# Patient Record
Sex: Male | Born: 1970 | Race: White | Hispanic: No | Smoking: Current every day smoker
Health system: Southern US, Community
[De-identification: ages and names within clinical notes are randomized; demographics above are authoritative.]

---

## 1998-08-04 ENCOUNTER — Emergency Department (HOSPITAL_COMMUNITY): Admission: EM | Admit: 1998-08-04 | Discharge: 1998-08-04 | Payer: Self-pay

## 2000-02-25 ENCOUNTER — Emergency Department (HOSPITAL_COMMUNITY): Admission: EM | Admit: 2000-02-25 | Discharge: 2000-02-25 | Payer: Self-pay | Admitting: Emergency Medicine

## 2000-10-05 ENCOUNTER — Emergency Department (HOSPITAL_COMMUNITY): Admission: EM | Admit: 2000-10-05 | Discharge: 2000-10-05 | Payer: Self-pay | Admitting: Emergency Medicine

## 2002-01-09 ENCOUNTER — Emergency Department (HOSPITAL_COMMUNITY): Admission: EM | Admit: 2002-01-09 | Discharge: 2002-01-09 | Payer: Self-pay

## 2002-05-16 ENCOUNTER — Emergency Department (HOSPITAL_COMMUNITY): Admission: EM | Admit: 2002-05-16 | Discharge: 2002-05-16 | Payer: Self-pay | Admitting: Emergency Medicine

## 2005-03-06 ENCOUNTER — Emergency Department (HOSPITAL_COMMUNITY): Admission: EM | Admit: 2005-03-06 | Discharge: 2005-03-06 | Payer: Self-pay | Admitting: Emergency Medicine

## 2005-06-09 ENCOUNTER — Emergency Department: Payer: Self-pay | Admitting: Emergency Medicine

## 2006-10-26 ENCOUNTER — Emergency Department: Payer: Self-pay | Admitting: Unknown Physician Specialty

## 2007-06-13 ENCOUNTER — Emergency Department (HOSPITAL_COMMUNITY): Admission: EM | Admit: 2007-06-13 | Discharge: 2007-06-13 | Payer: Self-pay | Admitting: Family Medicine

## 2008-04-28 ENCOUNTER — Emergency Department (HOSPITAL_COMMUNITY): Admission: EM | Admit: 2008-04-28 | Discharge: 2008-04-28 | Payer: Self-pay | Admitting: Emergency Medicine

## 2009-05-01 ENCOUNTER — Emergency Department: Payer: Self-pay | Admitting: Emergency Medicine

## 2011-09-24 LAB — RAPID STREP SCREEN (MED CTR MEBANE ONLY): Streptococcus, Group A Screen (Direct): NEGATIVE

## 2013-05-03 ENCOUNTER — Emergency Department (HOSPITAL_COMMUNITY): Payer: Self-pay

## 2013-05-03 ENCOUNTER — Emergency Department (HOSPITAL_COMMUNITY)
Admission: EM | Admit: 2013-05-03 | Discharge: 2013-05-03 | Disposition: A | Discharge status: Home / Self Care | Payer: Self-pay | Attending: Emergency Medicine | Admitting: Emergency Medicine

## 2013-05-03 ENCOUNTER — Encounter (HOSPITAL_COMMUNITY): Payer: Self-pay | Admitting: Emergency Medicine

## 2013-05-03 DIAGNOSIS — K0889 Other specified disorders of teeth and supporting structures: Secondary | ICD-10-CM

## 2013-05-03 DIAGNOSIS — K089 Disorder of teeth and supporting structures, unspecified: Secondary | ICD-10-CM | POA: Insufficient documentation

## 2013-05-03 DIAGNOSIS — F17200 Nicotine dependence, unspecified, uncomplicated: Secondary | ICD-10-CM | POA: Insufficient documentation

## 2013-05-03 DIAGNOSIS — R05 Cough: Secondary | ICD-10-CM | POA: Insufficient documentation

## 2013-05-03 DIAGNOSIS — R059 Cough, unspecified: Secondary | ICD-10-CM | POA: Insufficient documentation

## 2013-05-03 DIAGNOSIS — J3489 Other specified disorders of nose and nasal sinuses: Secondary | ICD-10-CM | POA: Insufficient documentation

## 2013-05-03 DIAGNOSIS — J069 Acute upper respiratory infection, unspecified: Secondary | ICD-10-CM | POA: Insufficient documentation

## 2013-05-03 MED ORDER — BENZONATATE 100 MG PO CAPS: 100.0000 mg | ORAL_CAPSULE | Freq: Three times a day (TID) | ORAL | Status: AC

## 2013-05-03 MED ORDER — PENICILLIN V POTASSIUM 500 MG PO TABS: 500.0000 mg | ORAL_TABLET | Freq: Four times a day (QID) | ORAL | Status: AC

## 2013-05-03 MED ORDER — TRAMADOL HCL 50 MG PO TABS: 50.0000 mg | ORAL_TABLET | Freq: Four times a day (QID) | ORAL | Status: AC | PRN

## 2013-05-03 NOTE — ED Notes (Signed)
Pt c/o left sided dental pain and cough and head congestion

## 2013-05-03 NOTE — ED Provider Notes (Signed)
History     CSN: [id number]  Arrival date & time 05/03/13  1106   First MD Initiated Contact with Patient 05/03/13 1146      Chief Complaint  Patient presents with  . Dental Pain  . Cough    (Consider location/radiation/quality/duration/timing/severity/associated sxs/prior treatment) Patient is a 42 y.o. male presenting with tooth pain and cough. The history is provided by the patient.  Dental PainThe primary symptoms include cough. Primary symptoms do not include dental injury, headaches, fever or shortness of breath. Episode onset: 2-3 weeks ago. The symptoms are worsening. The symptoms occur constantly.  Additional symptoms include: dental sensitivity to temperature, gum swelling and gum tenderness. Additional symptoms do not include: purulent gums, trismus, facial swelling, trouble swallowing, pain with swallowing and ear pain.   Cough Cough characteristics:  Productive Sputum characteristics:  Yellow Onset quality:  Gradual Duration:  2 weeks Progression:  Worsening Smoker: yes   Context: not sick contacts   Relieved by:  None tried Associated symptoms: rhinorrhea   Associated symptoms: no chest pain, no chills, no ear pain, no fever, no headaches, no shortness of breath and no wheezing     History reviewed. No pertinent past medical history.  History reviewed. No pertinent past surgical history.  History reviewed. No pertinent family history.  History  Substance Use Topics  . Smoking status: Current Every Day Smoker  . Smokeless tobacco: Not on file  . Alcohol Use: Yes     Comment: occ      Review of Systems  Constitutional: Negative for fever and chills.  HENT: Positive for congestion and rhinorrhea. Negative for ear pain, facial swelling, trouble swallowing, neck pain and neck stiffness.   Respiratory: Positive for cough. Negative for shortness of breath and wheezing.   Cardiovascular: Negative for chest pain.  Neurological: Negative for headaches.  All  other systems reviewed and are negative.    Allergies  Codeine and Vicodin  Home Medications   Current Outpatient Rx  Name  Route  Sig  Dispense  Refill  . Multiple Vitamin (MULTIVITAMIN WITH MINERALS) TABS   Oral   Take 1 tablet by mouth daily.           BP 126/60  Pulse 87  Temp(Src) 98.8 F (37.1 C) (Oral)  Resp 18  SpO2 97%  Physical Exam  Nursing note and vitals reviewed. Constitutional: He is oriented to person, place, and time. He appears well-developed and well-nourished. No distress.  HENT:  Head: Normocephalic and atraumatic. No trismus in the jaw.  Nose: Mucosal edema and rhinorrhea present.  Mouth/Throat: Uvula is midline, oropharynx is clear and moist and mucous membranes are normal. Abnormal dentition. No dental abscesses or edematous. No oropharyngeal exudate, posterior oropharyngeal edema, posterior oropharyngeal erythema or tonsillar abscesses.  Pt able to open and close mouth with out difficulty. Airway intact. Uvula midline. Mild gingival swelling with tenderness over affected area, but no fluctuance. No swelling or tenderness of submental and submandibular regions.  No sublingual tenderness.  Eyes: Conjunctivae and EOM are normal.  Neck: Normal range of motion and full passive range of motion without pain. Neck supple.  Cardiovascular: Normal rate, regular rhythm and normal heart sounds.   Pulmonary/Chest: Effort normal and breath sounds normal. No respiratory distress. He has no wheezes. He has no rales.  Musculoskeletal: Normal range of motion.  Lymphadenopathy:       Head (right side): No submental, no submandibular, no tonsillar, no preauricular and no posterior auricular adenopathy present.  Head (left side): No submental, no submandibular, no tonsillar, no preauricular and no posterior auricular adenopathy present.    He has no cervical adenopathy.  Neurological: He is alert and oriented to person, place, and time.  Skin: Skin is warm and  dry. No rash noted. He is not diaphoretic.  Psychiatric: He has a normal mood and affect.    ED Course  Procedures (including critical care time)  Labs Reviewed - No data to display Dg Chest 2 View  05/03/2013  *RADIOLOGY REPORT*  Clinical Data: Cough  CHEST - 2 VIEW  Comparison: None.  Findings: Lungs clear.  Heart size and pulmonary vascularity are normal.  No adenopathy.  No bone lesions.  IMPRESSION: No abnormality noted.   Original Report Authenticated By: Bretta Bang, M.D.      No diagnosis found.    MDM  Patient with toothache.  No gross abscess.  Exam unconcerning for Ludwig's angina or spread of infection.  Will treat with penicillin and pain medicine.  Urged patient to follow-up with dentist.  Pt CXR negative for acute infiltrate. Patients symptoms are consistent with URI, likely viral etiology. Discussed that antibiotics are not indicated for viral infections. Pt will be discharged with symptomatic treatment.  Verbalizes understanding and is agreeable with plan. Pt is hemodynamically stable & in NAD prior to dc.        Pascal [place], PA-C 05/03/13 1540

## 2013-05-03 NOTE — ED Notes (Signed)
Returned from radiology. 

## 2013-05-05 NOTE — ED Provider Notes (Signed)
Medical screening examination/treatment/procedure(s) were performed by non-physician practitioner and as supervising physician I was immediately available for consultation/collaboration.   Azarel Banner, MD 05/05/13 1534 

## 2017-11-27 ENCOUNTER — Emergency Department (HOSPITAL_BASED_OUTPATIENT_CLINIC_OR_DEPARTMENT_OTHER): Payer: PRIVATE HEALTH INSURANCE

## 2017-11-27 ENCOUNTER — Encounter (HOSPITAL_BASED_OUTPATIENT_CLINIC_OR_DEPARTMENT_OTHER): Payer: Self-pay | Admitting: *Deleted

## 2017-11-27 ENCOUNTER — Emergency Department (HOSPITAL_BASED_OUTPATIENT_CLINIC_OR_DEPARTMENT_OTHER)
Admission: EM | Admit: 2017-11-27 | Discharge: 2017-11-27 | Disposition: A | Discharge status: Home / Self Care | Payer: PRIVATE HEALTH INSURANCE | Attending: Emergency Medicine | Admitting: Emergency Medicine

## 2017-11-27 ENCOUNTER — Other Ambulatory Visit: Payer: Self-pay

## 2017-11-27 DIAGNOSIS — M25512 Pain in left shoulder: Secondary | ICD-10-CM | POA: Diagnosis not present

## 2017-11-27 DIAGNOSIS — R0789 Other chest pain: Secondary | ICD-10-CM | POA: Diagnosis not present

## 2017-11-27 DIAGNOSIS — R079 Chest pain, unspecified: Secondary | ICD-10-CM | POA: Diagnosis present

## 2017-11-27 DIAGNOSIS — F17210 Nicotine dependence, cigarettes, uncomplicated: Secondary | ICD-10-CM | POA: Insufficient documentation

## 2017-11-27 DIAGNOSIS — Z79899 Other long term (current) drug therapy: Secondary | ICD-10-CM | POA: Diagnosis not present

## 2017-11-27 LAB — BASIC METABOLIC PANEL
Anion gap: 5 (ref 5–15)
BUN: 16 mg/dL (ref 6–20)
CO2: 27 mmol/L (ref 22–32)
Calcium: 9.5 mg/dL (ref 8.9–10.3)
Chloride: 102 mmol/L (ref 101–111)
Creatinine, Ser: 0.94 mg/dL (ref 0.61–1.24)
GFR calc Af Amer: >60 mL/min (ref >60)
GFR calc non Af Amer: >60 mL/min (ref >60)
Glucose, Bld: 90 mg/dL (ref 65–99)
Potassium: 3.9 mmol/L (ref 3.5–5.1)
Sodium: 134 mmol/L — ABNORMAL LOW (ref 135–145)

## 2017-11-27 LAB — CBC
HCT: 41.2 % (ref 39.0–52.0)
Hemoglobin: 14.2 g/dL (ref 13.0–17.0)
MCH: 32.1 pg (ref 26.0–34.0)
MCHC: 34.5 g/dL (ref 30.0–36.0)
MCV: 93 fL (ref 78.0–100.0)
Platelets: 270 10*3/uL (ref 150–400)
RBC: 4.43 MIL/uL (ref 4.22–5.81)
RDW: 12.7 % (ref 11.5–15.5)
WBC: 6.5 10*3/uL (ref 4.0–10.5)

## 2017-11-27 LAB — CBG MONITORING, ED: Glucose-Capillary: 92 mg/dL (ref 65–99)

## 2017-11-27 LAB — TROPONIN I
Troponin I: <0.03 ng/mL (ref <0.03)
Troponin I: <0.03 ng/mL (ref <0.03)

## 2017-11-27 NOTE — ED Notes (Signed)
EDP spoke with Pt. Before last troponin about follow up care.

## 2017-11-27 NOTE — Discharge Instructions (Signed)
Please follow-up and establish care with a primary care provider, as well as with cardiology for further evaluation and treatment of your chest pain.  Please return to the emergency department if you develop any new or worsening symptoms.  You can take ibuprofen as prescribed over-the-counter, as needed for your pain, as it may have a musculoskeletal cause as well.

## 2017-11-27 NOTE — ED Notes (Signed)
Pt. Reports he had a family issue that started this chest pain he feels today.

## 2017-11-27 NOTE — ED Provider Notes (Signed)
[facility] EMERGENCY DEPARTMENT Provider Note   CSN: [id number] Arrival date & time: 11/27/17  1249     History   Chief Complaint Chief Complaint  Patient presents with  . Chest Pain    HPI Gregory Pierce is a 46 y.o. male who is previously healthy who presents with chest pain after getting into an argument at home.  Patient reports he has had increased stress in his life lately.  He has had chest pain in the past related to anxiety.  Patient describes his pain as a  dull ache at this time, however reports prior it was sharp.  He denies any significant shortness of breath.  He denies pleuritic symptoms.  He has had some radiation of pain to his left shoulder and left back, as well as left ribs.  He denies any abdominal pain, nausea, vomiting, urinary symptoms.  He took 2 full dose aspirin prior to arrival.  He is not on any medication for anxiety.  He does smoke.  He does not have any other known medical problems.  He denies any recent long trips, surgeries, known cancer, new leg pain or swelling, history of blood clots.  Patient does have family history of heart disease.  HPI  History reviewed. No pertinent past medical history.  There are no active problems to display for this patient.   History reviewed. No pertinent surgical history.     Home Medications    Prior to Admission medications   Medication Sig Start Date End Date Taking? Authorizing Provider  Multiple Vitamin (MULTIVITAMIN WITH MINERALS) TABS Take 1 tablet by mouth daily.   Yes [provider]  benzonatate (TESSALON) 100 MG capsule Take 1 capsule (100 mg total) by mouth every 8 (eight) hours. 05/03/13   Santiago GladLaisure, Heather, PA-C  traMADol (ULTRAM) 50 MG tablet Take 1 tablet (50 mg total) by mouth every 6 (six) hours as needed for pain. 05/03/13   Santiago GladLaisure, Heather, PA-C    Family History No family history on file.  Social History Social History   Tobacco Use  . Smoking status: Current Every  Day Smoker  . Smokeless tobacco: Never Used  Substance Use Topics  . Alcohol use: Yes    Comment: occ  . Drug use: No     Allergies   Codeine and Vicodin [hydrocodone-acetaminophen]   Review of Systems Review of Systems  Constitutional: Negative for chills and fever.  HENT: Negative for facial swelling and sore throat.   Respiratory: Negative for shortness of breath.   Cardiovascular: Positive for chest pain. Negative for leg swelling.  Gastrointestinal: Negative for abdominal pain, nausea and vomiting.  Genitourinary: Negative for dysuria.  Musculoskeletal: Negative for back pain.  Skin: Negative for rash and wound.  Neurological: Negative for headaches.  Psychiatric/Behavioral: The patient is not nervous/anxious.      Physical Exam Updated Vital Signs BP (!) 104/51 (BP Location: Left Arm)   Pulse (!) 55   Temp 97.6 F (36.4 C) (Oral)   Resp 15   Ht 5\' 10"  (1.778 m)   Wt 69.4 kg (153 lb)   SpO2 98%   BMI 21.95 kg/m   Physical Exam  Constitutional: He appears well-developed and well-nourished. No distress.  HENT:  Head: Normocephalic and atraumatic.  Mouth/Throat: Oropharynx is clear and moist. No oropharyngeal exudate.  Eyes: Conjunctivae are normal. Pupils are equal, round, and reactive to light. Right eye exhibits no discharge. Left eye exhibits no discharge. No scleral icterus.  Neck: Normal range of motion.  Neck supple. No thyromegaly present.  Cardiovascular: Normal rate, regular rhythm, normal heart sounds and intact distal pulses. Exam reveals no gallop and no friction rub.  No murmur heard. Pulmonary/Chest: Effort normal and breath sounds normal. No stridor. No respiratory distress. He has no wheezes. He has no rales.        Abdominal: Soft. Bowel sounds are normal. He exhibits no distension. There is no tenderness. There is no rebound and no guarding.  Musculoskeletal: He exhibits no edema.  Lymphadenopathy:    He has no cervical adenopathy.    Neurological: He is alert. Coordination normal.  Skin: Skin is warm and dry. No rash noted. He is not diaphoretic. No pallor.  Psychiatric: He has a normal mood and affect.  Nursing note and vitals reviewed.    ED Treatments / Results  Labs (all labs ordered are listed, but only abnormal results are displayed) Labs Reviewed  BASIC METABOLIC PANEL - Abnormal; Notable for the following components:      Result Value   Sodium 134 (*)    All other components within normal limits  CBC  TROPONIN I  TROPONIN I  CBG MONITORING, ED    EKG  EKG Interpretation  Date/Time:  Friday November 27 2017 13:04:14 EST Ventricular Rate:  59 PR Interval:    QRS Duration: 86 QT Interval:  418 QTC Calculation: 415 R Axis:   92 Text Interpretation:  Sinus rhythm `no acute st/t changes Confirmed by Cathren LaineSteinl, Kevin ([id number]) on 11/27/2017 1:13:01 PM       Radiology Dg Chest 2 View  Result Date: 11/27/2017 CLINICAL DATA:  LEFT-sided chest pain. Smoker. No shortness of breath. EXAM: CHEST  2 VIEW COMPARISON:  05/03/2013. FINDINGS: The heart size and mediastinal contours are within normal limits. Both lungs are clear. The visualized skeletal structures are unremarkable. IMPRESSION: No active cardiopulmonary disease.  No change from priors. Electronically Signed   By: Elsie StainJohn T Curnes M.D.   On: 11/27/2017 13:47    Procedures Procedures (including critical care time)  Medications Ordered in ED Medications - No data to display   Initial Impression / Assessment and Plan / ED Course  I have reviewed the triage vital signs and the nursing notes.  Pertinent labs & imaging results that were available during my care of the patient were reviewed by me and considered in my medical decision making (see chart for details).     Patient with most likely chest pain related to anxiety or musculoskeletal cause.  Labs are unremarkable.  Delta troponin negative.  PERC negative. HEART score is 3. Chest x-ray is  normal and unchanged from priors.  EKG shows NSR and no ST or T wave abnormalities.  Will discharge patient home with follow-up to PCP and cardiology for recheck and further evaluation.  Return precautions discussed.  Patient understands and agrees with plan.  Patient vitals stable throughout ED course and discharged in satisfactory condition.  Patient also evaluated by Dr. Denton LankSteinl who guided the patient's management and agrees with plan.  Final Clinical Impressions(s) / ED Diagnoses   Final diagnoses:  Atypical chest pain    ED Discharge Orders    None       Emi HolesLaw, Cinderella Christoffersen M, PA-C 11/28/17 [id number]    Cathren LaineSteinl, Kevin, MD 11/28/17 1217

## 2017-11-27 NOTE — ED Triage Notes (Signed)
States he had anxiety and drove to his friends house. He was having chest pain and tried to control his breathing but the sharp pain in his chest continues.

## 2017-11-27 NOTE — ED Notes (Signed)
CBG is 92. 

## 2018-03-24 ENCOUNTER — Emergency Department (HOSPITAL_BASED_OUTPATIENT_CLINIC_OR_DEPARTMENT_OTHER)
Admission: EM | Admit: 2018-03-24 | Discharge: 2018-03-24 | Disposition: A | Discharge status: Home / Self Care | Payer: PRIVATE HEALTH INSURANCE | Attending: Emergency Medicine | Admitting: Emergency Medicine

## 2018-03-24 ENCOUNTER — Encounter (HOSPITAL_BASED_OUTPATIENT_CLINIC_OR_DEPARTMENT_OTHER): Payer: Self-pay

## 2018-03-24 ENCOUNTER — Other Ambulatory Visit: Payer: Self-pay

## 2018-03-24 DIAGNOSIS — Z79899 Other long term (current) drug therapy: Secondary | ICD-10-CM | POA: Diagnosis not present

## 2018-03-24 DIAGNOSIS — Y990 Civilian activity done for income or pay: Secondary | ICD-10-CM | POA: Diagnosis not present

## 2018-03-24 DIAGNOSIS — S46911A Strain of unspecified muscle, fascia and tendon at shoulder and upper arm level, right arm, initial encounter: Secondary | ICD-10-CM | POA: Diagnosis not present

## 2018-03-24 DIAGNOSIS — Y33XXXA Other specified events, undetermined intent, initial encounter: Secondary | ICD-10-CM | POA: Insufficient documentation

## 2018-03-24 DIAGNOSIS — Y9289 Other specified places as the place of occurrence of the external cause: Secondary | ICD-10-CM | POA: Diagnosis not present

## 2018-03-24 DIAGNOSIS — S4991XA Unspecified injury of right shoulder and upper arm, initial encounter: Secondary | ICD-10-CM | POA: Diagnosis present

## 2018-03-24 DIAGNOSIS — F17210 Nicotine dependence, cigarettes, uncomplicated: Secondary | ICD-10-CM | POA: Diagnosis not present

## 2018-03-24 DIAGNOSIS — Y9389 Activity, other specified: Secondary | ICD-10-CM | POA: Diagnosis not present

## 2018-03-24 MED ORDER — IBUPROFEN 400 MG PO TABS: 400.0000 mg | ORAL_TABLET | Freq: Four times a day (QID) | ORAL | 0 refills | Status: AC | PRN

## 2018-03-24 NOTE — ED Triage Notes (Addendum)
C/o "injured shoulder over time"-states increase in pain to right shoulder x 2 days-pain worse with movement-NAD-steady gait

## 2018-03-24 NOTE — Discharge Instructions (Signed)
You may alternate taking Tylenol and Ibuprofen as needed for pain control. You may take 400-600 mg of ibuprofen every 6 hours and [phone] mg of Tylenol every 6 hours. Do not exceed 4000 mg of Tylenol daily as this can lead to liver damage. Also, make sure to take Ibuprofen with meals as it can cause an upset stomach. Do not take other NSAIDs while taking Ibuprofen such as (Aleve, Naprosyn, Aspirin, Celebrex, etc) and do not take more than the prescribed dose as this can lead to ulcers and bleeding in your GI tract. You may use warm and cold compresses to help with your symptoms.   Please follow up with your primary doctor within the next 7-10 days for re-evaluation and further treatment of your symptoms.  If symptoms do not improve you may follow-up with the orthopedic doctor that is listed above to schedule appointment for further treatment.  Please return to the ER sooner if you have any new or worsening symptoms.

## 2018-03-24 NOTE — ED Notes (Signed)
ED Provider at bedside. 

## 2018-03-24 NOTE — ED Provider Notes (Signed)
[facility] EMERGENCY DEPARTMENT Provider Note   CSN: [id number] Arrival date & time: 03/24/18  1312     History   Chief Complaint Chief Complaint  Patient presents with  . Shoulder Injury    HPI Gregory Pierce is a 47 y.o. male.  HPI   Pt is a 47 y/o male with h/o right rotator cuff tendinitis presents to the ED today c/o of right shoulder pain that has been ongoing for several months to a year that worsened yesterday after lifting heavy objects while at work. Pain is located to the posterior aspect of the shoulder. Pain is about 7/10 at rest and 10/10 with movement. Pain radiates down the RLE to the hand. Also c/o pain to right trapezius muscles and right side of neck. 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 Reports paresthesias to thumb and 3rd, 4th, and 5th digits. No numbness or weakness to hands or upper extremities.  Pt is right handed.  No fevers, chest pain, shortness of breath, or syncope.  No associated diaphoresis, nausea, vomiting. No recent falls or trauma.  He has not taken any pain medications for his symptoms.  History reviewed. No pertinent past medical history.  There are no active problems to display for this patient.   History reviewed. No pertinent surgical history.      Home Medications    Prior to Admission medications   Medication Sig Start Date End Date Taking? Authorizing Provider  benzonatate (TESSALON) 100 MG capsule Take 1 capsule (100 mg total) by mouth every 8 (eight) hours. 05/03/13   Santiago GladLaisure, Heather, PA-C  ibuprofen (ADVIL,MOTRIN) 400 MG tablet Take 1 tablet (400 mg total) by mouth every 6 (six) hours as needed. 03/24/18   Brand Siever S, PA-C  Multiple Vitamin (MULTIVITAMIN WITH MINERALS) TABS Take 1 tablet by mouth daily.    [provider]  traMADol (ULTRAM) 50 MG tablet Take 1 tablet (50 mg total) by mouth every 6 (six) hours as needed for  pain. 05/03/13   Santiago GladLaisure, Heather, PA-C    Family History No family history on file.  Social History Social History   Tobacco Use  . Smoking status: Current Every Day Smoker    Types: Cigarettes  . Smokeless tobacco: Never Used  Substance Use Topics  . Alcohol use: Not Currently  . Drug use: No     Allergies   Codeine and Vicodin [hydrocodone-acetaminophen]   Review of Systems Review of Systems  Constitutional: Negative for diaphoresis.  Respiratory: Negative for shortness of breath.   Cardiovascular: Negative for chest pain.  Gastrointestinal: Negative for nausea and vomiting.  Musculoskeletal:       Right shoulder pain, right trapezius pain  Skin: Negative for rash.  Neurological: Negative for syncope and headaches.     Physical Exam Updated Vital Signs BP 116/63 (BP Location: Right Arm)   Pulse 64   Temp 98 F (36.7 C)   Resp 18   Ht 5\' 10"  (1.778 m)   Wt 69.4 kg (153 lb)   SpO2 98%   BMI 21.95 kg/m   Physical Exam  Constitutional: He is oriented to person, place, and time. He appears well-developed and well-nourished. No distress.  Eyes: Conjunctivae are normal.  Neck: Normal range of motion. Neck supple.  Cardiovascular: Normal rate, regular rhythm, normal heart sounds and intact distal pulses.  No murmur heard. Pulmonary/Chest: Effort normal and breath sounds normal. No  respiratory distress. He has no wheezes.  Abdominal: Soft. There is no tenderness.  Musculoskeletal: Normal range of motion.   no instability, tenderness, or deformity of acromioclavicular and sternoclavicular joints, or of the cervical spine. Pt has ttp along posterior aspect of shoulder and right trapezius muscle.  No erythema or warmth to shoulder joint.  Patient has full range of motion of bilateral shoulders.  Also with full strength to bilateral upper extremities.  Normal sensation and grip strength bilaterally.  Distal pulses intact.  Brisk cap refill.   Neurological: He is alert  and oriented to person, place, and time.  Skin: Skin is warm and dry.     ED Treatments / Results  Labs (all labs ordered are listed, but only abnormal results are displayed) Labs Reviewed - No data to display  EKG None  Radiology No results found.  Procedures Procedures (including critical care time)  Medications Ordered in ED Medications - No data to display   Initial Impression / Assessment and Plan / ED Course  I have reviewed the triage vital signs and the nursing notes.  Pertinent labs & imaging results that were available during my care of the patient were reviewed by me and considered in my medical decision making (see chart for details).     Final Clinical Impressions(s) / ED Diagnoses   Final diagnoses:  Muscle strain, shoulder region, right, initial encounter   Patient with right shoulder pain.  Vital signs stable.  No acute distress.  No recent injury.  No evidence of septic joint.  Suspect muscle strain.  Advised Tylenol or Profen for pain as well as rice protocol.  Encarnacion Chu O follow-up if symptoms persist.  Advised PCP follow-up within 1 week for recheck.  Return precautions discussed and patient understands reasons to return.  All questions answered.  ED Discharge Orders        Ordered    ibuprofen (ADVIL,MOTRIN) 400 MG tablet  Every 6 hours PRN     03/24/18 1641       Milley Vining S, PA-C 03/24/18 1648    Alvira Monday, MD 03/26/18 1349

## 2019-02-13 IMAGING — DX DG CHEST 2V
2 series · 2 of 2 positions shown · non-contrast
Comparison: 05/03/2013.

CLINICAL DATA: LEFT-sided chest pain. Smoker. No shortness of
breath.

EXAM:
CHEST  2 VIEW

[chest pa]
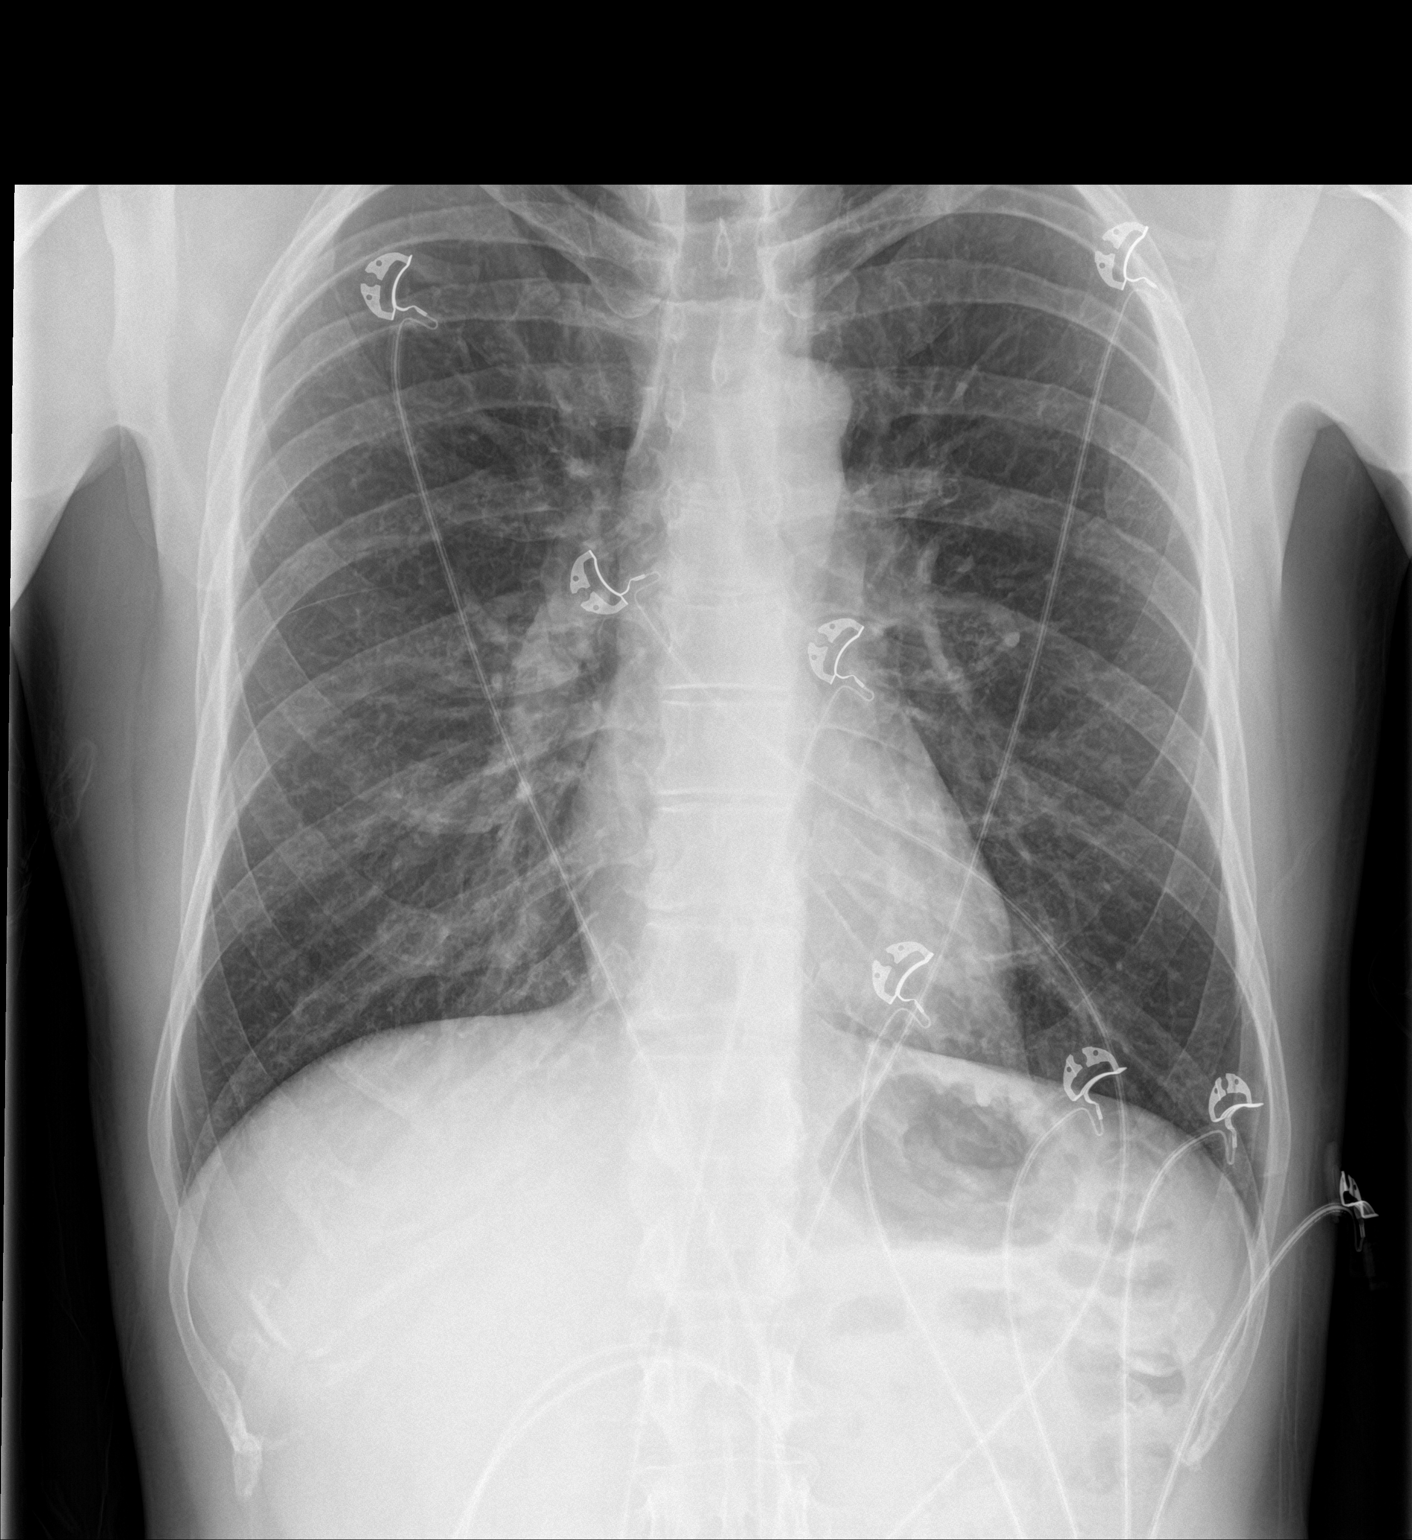

[chest lat]
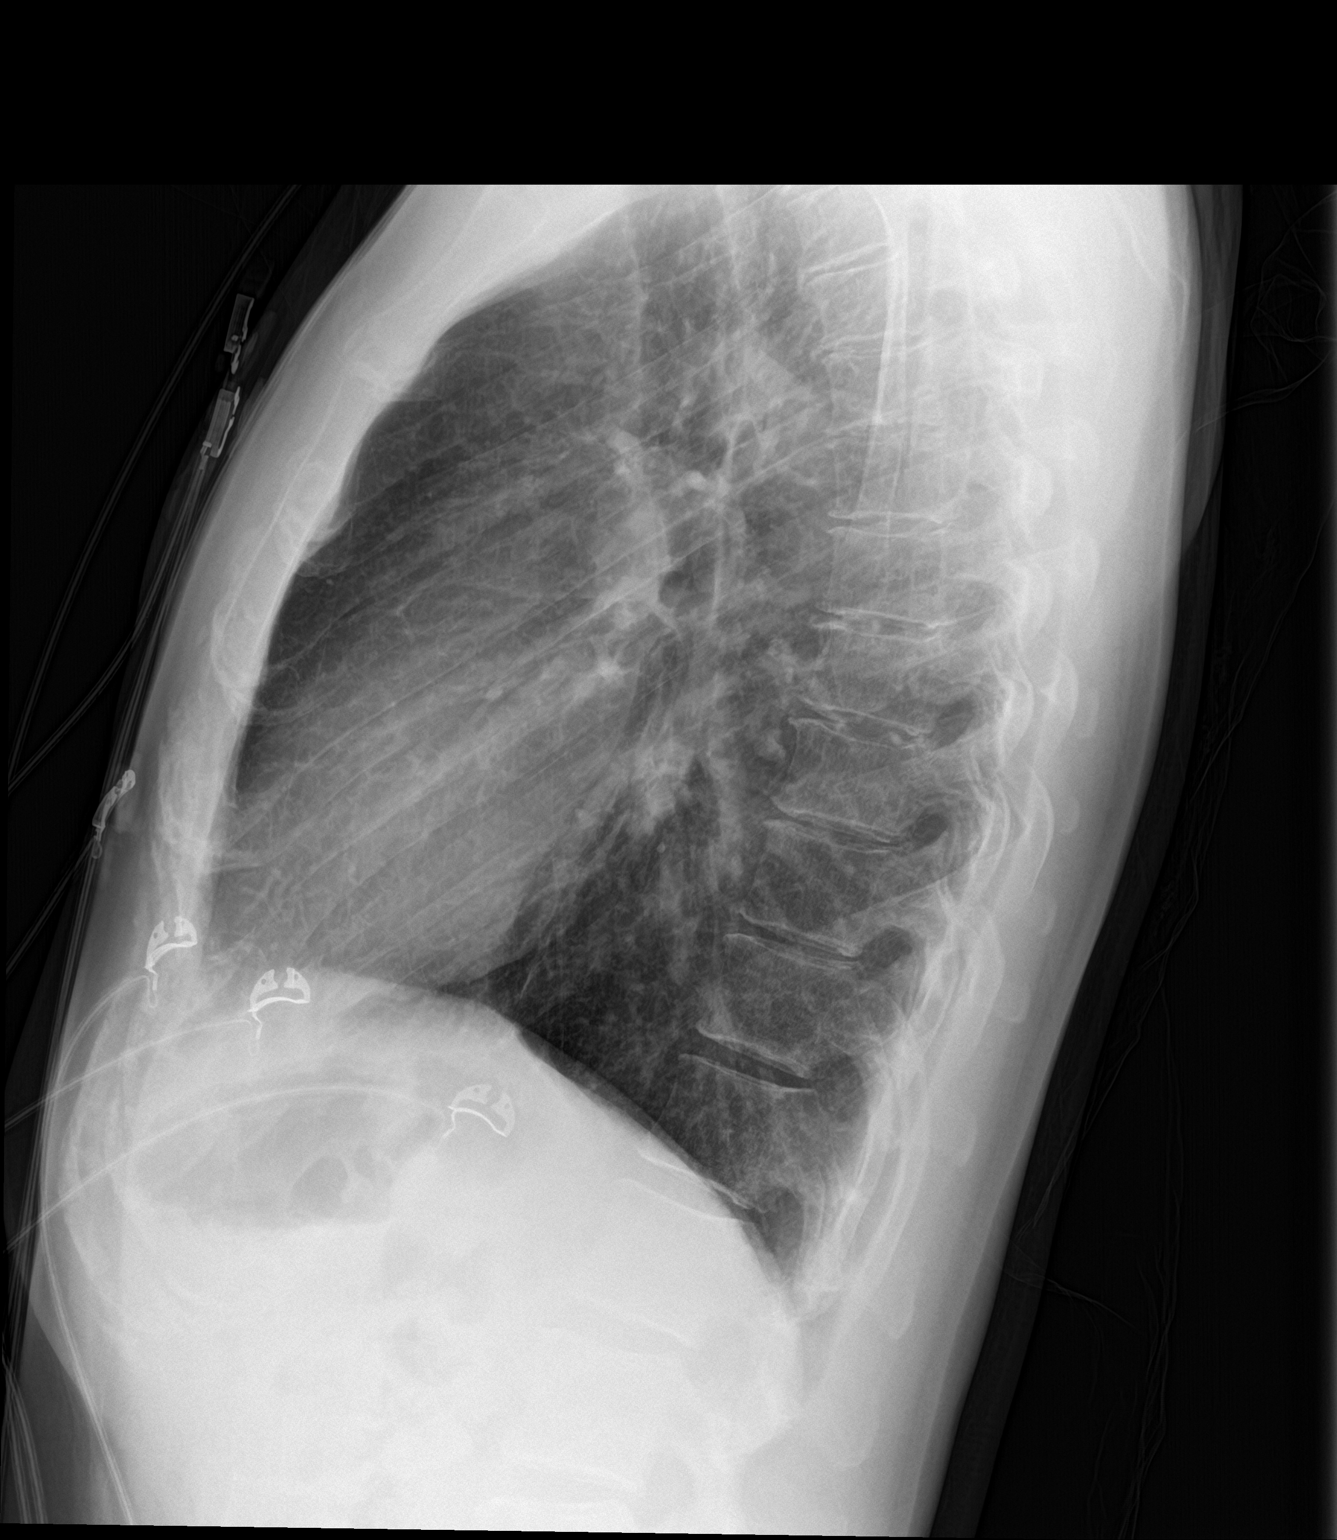

[2 of 2 positions shown; findings below may reference images not displayed]

FINDINGS: The heart size and mediastinal contours are within normal limits.
Both lungs are clear. The visualized skeletal structures are
unremarkable.
IMPRESSION: No active cardiopulmonary disease.  No change from priors.

## 2023-04-15 ENCOUNTER — Other Ambulatory Visit: Payer: Self-pay

## 2023-04-15 ENCOUNTER — Emergency Department (HOSPITAL_BASED_OUTPATIENT_CLINIC_OR_DEPARTMENT_OTHER)
Admission: EM | Admit: 2023-04-15 | Discharge: 2023-04-15 | Disposition: A | Discharge status: Home / Self Care | Payer: PRIVATE HEALTH INSURANCE | Attending: Emergency Medicine | Admitting: Emergency Medicine

## 2023-04-15 DIAGNOSIS — Z87891 Personal history of nicotine dependence: Secondary | ICD-10-CM | POA: Insufficient documentation

## 2023-04-15 DIAGNOSIS — R0981 Nasal congestion: Secondary | ICD-10-CM | POA: Diagnosis present

## 2023-04-15 NOTE — ED Notes (Signed)
Discharge paperwork reviewed entirely with patient, including Rx's and follow up care. Pain was under control. Pt verbalized understanding as well as all parties involved. No questions or concerns voiced at the time of discharge. No acute distress noted.  Patient was advised to contact ENT ASAP to set up a specialist appointment.   Pt ambulated out to PVA without incident or assistance.

## 2023-04-15 NOTE — ED Triage Notes (Signed)
Reports nasal congestion for 3 weeks now and now feels like there's a growth on his left nostrils that bleeds when he attempts to remove it.

## 2023-04-15 NOTE — Discharge Instructions (Addendum)
It was a pleasure taking care of you today!  Attached are several ENT offices local to the area that you may call and set up a follow-up appointment regarding today's ED visit.  Dr. Jearld Fenton is with [facility].  [facility] and [facility] are both with [facility] or [facility].  You may use over-the-counter Flonase as needed for your symptoms.  It is important that you get in with ENT as soon as possible.  You may follow-up with your primary care provider as needed.  Return to the emergency department if you are experiencing increasing/worsening symptoms.

## 2023-04-15 NOTE — ED Provider Notes (Signed)
[facility] Provider Note   CSN: [id number] Arrival date & time: 04/15/23  1504     History  Chief Complaint  Patient presents with   Nasal Congestion    Gregory Pierce is a 52 y.o. male who presents to the ED with concerns for nasal congestion x 3 weeks. Hasn't been seen for his symptoms.  No meds tried at home for symptoms.  Patient notes that he used to smoke however stopped at this time.  Denies excessive EtOH use or chewing tobacco use.  Denies fever, trouble swallowing, trouble breathing.   The history is provided by the patient. No language interpreter was used.       Home Medications Prior to Admission medications   Medication Sig Start Date End Date Taking? Authorizing Provider  benzonatate (TESSALON) 100 MG capsule Take 1 capsule (100 mg total) by mouth every 8 (eight) hours. 05/03/13   Santiago Glad, PA-C  ibuprofen (ADVIL,MOTRIN) 400 MG tablet Take 1 tablet (400 mg total) by mouth every 6 (six) hours as needed. 03/24/18   Couture, Cortni S, PA-C  Multiple Vitamin (MULTIVITAMIN WITH MINERALS) TABS Take 1 tablet by mouth daily.    [provider]  traMADol (ULTRAM) 50 MG tablet Take 1 tablet (50 mg total) by mouth every 6 (six) hours as needed for pain. 05/03/13   Santiago Glad, PA-C      Allergies    Codeine and Vicodin [hydrocodone-acetaminophen]    Review of Systems   Review of Systems  All other systems reviewed and are negative.   Physical Exam Updated Vital Signs BP 112/74 (BP Location: Left Arm)   Pulse 89   Temp 98.2 F (36.8 C)   Resp 18   Ht  (1.778 m)   Wt 86.2 kg   SpO2 99%   BMI 27.26 kg/m  Physical Exam Vitals and nursing note reviewed.  Constitutional:      General: He is not in acute distress.    Appearance: Normal appearance.  HENT:     Nose: No signs of injury or nasal tenderness.     Left Nostril: Occlusion present.     Comments: Growth noted to left nare that is  somewhat translucent on exam. Unable to visualize past growth into nare. No appreciable bleeding noted.  Eyes:     General: No scleral icterus.    Extraocular Movements: Extraocular movements intact.  Cardiovascular:     Rate and Rhythm: Normal rate.  Pulmonary:     Effort: Pulmonary effort is normal. No respiratory distress.  Abdominal:     Palpations: Abdomen is soft. There is no mass.     Tenderness: There is no abdominal tenderness.  Musculoskeletal:        General: Normal range of motion.     Cervical back: Neck supple.  Skin:    General: Skin is warm and dry.     Findings: No rash.  Neurological:     Mental Status: He is alert.     Sensory: Sensation is intact.     Motor: Motor function is intact.  Psychiatric:        Behavior: Behavior normal.     ED Results / Procedures / Treatments   Labs (all labs ordered are listed, but only abnormal results are displayed) Labs Reviewed - No data to display  EKG None  Radiology No results found.  Procedures Procedures    Medications Ordered in ED Medications - No data to display  ED Course/ Medical Decision Making/ A&P Clinical Course as of 04/15/23 1539  Wed Apr 15, 2023  1533 Evaluated personally at bedside: Nasal growth, discussed concerns for malignancy. Referred to ENT for ASAP evaluation.   [CC]    Clinical Course User Index [CC] Glyn Ade, MD                             Medical Decision Making  Pt presents with nasal congestion onset 3 weeks. Vital signs, patient afebrile. On exam, pt with Growth noted to left nare that is somewhat translucent on exam. Unable to visualize past growth into nare. No appreciable bleeding noted.   Disposition: Presenting suspicious for nasal congestion in the setting of nasal growth to the left nare. After consideration of the diagnostic results and the patients response to treatment, I feel that the patient would benefit from Discharge home.  Instructed that patient  may use OTC flonase, however, its important that he is seen by ENT ASAP. Patient provided with 3 ENT specialist for follow-up.  Supportive care measures and strict return precautions discussed with patient at bedside. Pt acknowledges and verbalizes understanding. Pt appears safe for discharge. Follow up as indicated in discharge paperwork.    This chart was dictated using voice recognition software, Dragon. Despite the best efforts of this provider to proofread and correct errors, errors may still occur which can change documentation meaning. Final Clinical Impression(s) / ED Diagnoses Final diagnoses:  Nasal congestion    Rx / DC Orders ED Discharge Orders     None         Meli Faley A, PA-C 04/15/23 1539    Glyn Ade, MD 04/17/23 1502
